# Patient Record
Sex: Male | Born: 1985 | Race: Black or African American | Hispanic: No | Marital: Single | Smoking: Never smoker
Health system: Southern US, Community
[De-identification: ages and names within clinical notes are randomized; demographics above are authoritative.]

---

## 2016-01-02 ENCOUNTER — Encounter (HOSPITAL_COMMUNITY): Payer: Self-pay

## 2016-01-02 ENCOUNTER — Emergency Department (HOSPITAL_COMMUNITY)
Admission: EM | Admit: 2016-01-02 | Discharge: 2016-01-02 | Disposition: A | Discharge status: Home / Self Care | Payer: Worker's Compensation | Attending: Physician Assistant | Admitting: Physician Assistant

## 2016-01-02 ENCOUNTER — Emergency Department (HOSPITAL_COMMUNITY): Payer: Worker's Compensation

## 2016-01-02 DIAGNOSIS — S61412A Laceration without foreign body of left hand, initial encounter: Secondary | ICD-10-CM | POA: Insufficient documentation

## 2016-01-02 DIAGNOSIS — W3189XA Contact with other specified machinery, initial encounter: Secondary | ICD-10-CM | POA: Insufficient documentation

## 2016-01-02 DIAGNOSIS — Y990 Civilian activity done for income or pay: Secondary | ICD-10-CM | POA: Insufficient documentation

## 2016-01-02 DIAGNOSIS — S60032A Contusion of left middle finger without damage to nail, initial encounter: Secondary | ICD-10-CM | POA: Diagnosis not present

## 2016-01-02 DIAGNOSIS — S60022A Contusion of left index finger without damage to nail, initial encounter: Secondary | ICD-10-CM | POA: Diagnosis not present

## 2016-01-02 DIAGNOSIS — Y9389 Activity, other specified: Secondary | ICD-10-CM | POA: Insufficient documentation

## 2016-01-02 DIAGNOSIS — Y9289 Other specified places as the place of occurrence of the external cause: Secondary | ICD-10-CM | POA: Diagnosis not present

## 2016-01-02 DIAGNOSIS — S6000XA Contusion of unspecified finger without damage to nail, initial encounter: Secondary | ICD-10-CM

## 2016-01-02 MED ORDER — LIDOCAINE HCL (PF) 1 % IJ SOLN
30.0000 mL | Freq: Once | INTRAMUSCULAR | Status: AC
  Administered 2016-01-02: 30 mL via INTRADERMAL
  Filled 2016-01-02: qty 30

## 2016-01-02 MED ORDER — TETANUS-DIPHTH-ACELL PERTUSSIS 5-2.5-18.5 LF-MCG/0.5 IM SUSP
0.5000 mL | Freq: Once | INTRAMUSCULAR | Status: AC
  Administered 2016-01-02: 0.5 mL via INTRAMUSCULAR
  Filled 2016-01-02: qty 0.5

## 2016-01-02 MED ORDER — IBUPROFEN 400 MG PO TABS
600.0000 mg | ORAL_TABLET | Freq: Once | ORAL | Status: AC
  Administered 2016-01-02: 600 mg via ORAL
  Filled 2016-01-02: qty 1

## 2016-01-02 NOTE — ED Notes (Signed)
NP at bedside for suture placement.

## 2016-01-02 NOTE — ED Triage Notes (Signed)
Per Pt, Pt hit the outside of his left on a machine at work. Pt has a one inch laceration noted to the middle knuckle of the left hand that is bleeding.

## 2016-01-02 NOTE — ED Notes (Signed)
Pt and family verbalized d/c instructions, ambulatory to [facility]. Work not provided.

## 2016-01-02 NOTE — ED Provider Notes (Signed)
[facility]-EMERGENCY DEPT Provider Note   CSN: [id number] Arrival date & time: 01/02/16  1805   By signing my name below, I, Stanley Jones, attest that this documentation has been prepared under the direction and in the presence of [name], [place]. Electronically Signed: Clarisse GougeXavier Jones, Scribe. 01/03/16. 1:44 AM.   History   Chief Complaint Chief Complaint  Patient presents with  . Extremity Laceration   The history is provided by the patient. No language interpreter was used.    HPI Comments: Stanley Jones is a 30 y.o. male who presents to the Emergency Department complaining of a left hand laceration. He states he hit his hand on a machine at work. Wound currently bleeding. He has not taken any medications for pain. Pt denies numbness or tingling in his fingers. Last tetanus 2011.   History reviewed. No pertinent past medical history.  There are no active problems to display for this patient.   History reviewed. No pertinent surgical history.     Home Medications    Prior to Admission medications   Not on File    Family History No family history on file.  Social History Social History  Substance Use Topics  . Smoking status: Never Smoker  . Smokeless tobacco: Never Used  . Alcohol use Yes     Comment: occasionally      Allergies   Patient has no known allergies.   Review of Systems Review of Systems  Skin: Positive for wound. Negative for color change.  Neurological: Negative for weakness and numbness.  All other systems reviewed and are negative.    Physical Exam Updated Vital Signs BP 137/79 (BP Location: Right Arm)   Pulse 82   Temp 98.2 F (36.8 C) (Oral)   Resp 16   Ht 5\' 10"  (1.778 m)   Wt 83.5 kg   SpO2 100%   BMI 26.40 kg/m   Physical Exam  Constitutional: He is oriented to person, place, and time. He appears well-developed and well-nourished. No distress.  HENT:  Head: Normocephalic and atraumatic.  Mouth/Throat: Mucous  membranes are normal.  Eyes: EOM are normal.  Neck: Normal range of motion. Neck supple.  Cardiovascular: Normal rate.   Pulmonary/Chest: Effort normal.  Musculoskeletal: Normal range of motion.       Left hand: He exhibits tenderness and laceration. He exhibits normal range of motion, normal capillary refill, no deformity and no swelling. Normal sensation noted. Normal strength noted. He exhibits no thumb/finger opposition.       Hands: Neurological: He is alert and oriented to person, place, and time. He has normal strength. No sensory deficit.  Skin: Skin is warm, dry and intact. No rash noted. No cyanosis.  Psychiatric: He has a normal mood and affect. His speech is normal and behavior is normal. Thought content normal.  Nursing note and vitals reviewed.    ED Treatments / Results  DIAGNOSTIC STUDIES: Oxygen Saturation is 100% on RA, normal  by my interpretation.    COORDINATION OF CARE: 1:44 AM Discussed treatment plan with pt at bedside and pt agreed to plan.  Labs (all labs ordered are listed, but only abnormal results are displayed) Labs Reviewed - No data to display   Radiology Dg Hand Complete Left  Result Date: 01/02/2016 CLINICAL DATA:  Lacerations around the second and third MCP joints after hitting hand on an oven. EXAM: LEFT HAND - COMPLETE 3+ VIEW COMPARISON:  None. FINDINGS: There is no evidence of acute fracture or dislocation. No radiopaque foreign  body. Carpal rows are maintained. The distal radius and ulna are unremarkable. There is no evidence of arthropathy or other focal bone abnormality. Soft tissues are unremarkable. IMPRESSION: Negative. Electronically Signed   By: Tollie Ethavid  Kwon M.D.   On: 01/02/2016 19:37    Procedures .Marland Kitchen.Laceration Repair Date/Time: 01/02/2016 8:03 PM Performed by: [name], HOPE M Authorized by: [name], HOPE M   Consent:    Consent obtained:  Verbal   Consent given by:  Patient   Risks discussed:  Infection and pain Anesthesia (see  MAR for exact dosages):    Anesthesia method:  Local infiltration   Local anesthetic:  Lidocaine 1% w/o epi Laceration details:    Location:  Hand   Hand location:  L hand, dorsum (Base of ring finger)   Length (cm):  1.2 Repair type:    Repair type:  Simple Pre-procedure details:    Preparation:  Patient was prepped and draped in usual sterile fashion and imaging obtained to evaluate for foreign bodies Exploration:    Hemostasis achieved with:  Direct pressure   Wound exploration: wound explored through full range of motion     Contaminated: no   Treatment:    Area cleansed with:  Saline and Betadine   Amount of cleaning:  Standard   Irrigation solution:  Sterile saline   Irrigation method:  Syringe   Visualized foreign bodies/material removed: no   Skin repair:    Repair method:  Sutures   Suture size:  5-0   Suture material:  Prolene   Suture technique:  Simple interrupted   Number of sutures:  2 Approximation:    Approximation:  Close   Vermilion border: well-aligned   Post-procedure details:    Dressing:  Sterile dressing   Patient tolerance of procedure:  Tolerated well, no immediate complications Comments:     Tetanus updated .Marland Kitchen.Laceration Repair Date/Time: 01/02/2016 8:06 PM Performed by: [name], HOPE M Authorized by: [name], HOPE M   Consent:    Consent obtained:  Verbal   Consent given by:  Patient   Risks discussed:  Infection and pain Anesthesia (see MAR for exact dosages):    Anesthesia method:  Local infiltration   Local anesthetic:  Lidocaine 1% w/o epi Laceration details:    Location:  Hand   Hand location:  L hand, dorsum   Length (cm):  1.3 (Base of middle finger.) Repair type:    Repair type:  Simple Pre-procedure details:    Preparation:  Patient was prepped and draped in usual sterile fashion and imaging obtained to evaluate for foreign bodies Exploration:    Hemostasis achieved with:  Direct pressure   Wound exploration: wound explored  through full range of motion and entire depth of wound probed and visualized     Contaminated: no   Treatment:    Area cleansed with:  Betadine and saline   Amount of cleaning:  Standard   Irrigation solution:  Sterile saline   Irrigation method:  Syringe   Visualized foreign bodies/material removed: no   Skin repair:    Repair method:  Sutures   Suture size:  5-0   Suture material:  Prolene   Suture technique:  Simple interrupted   Number of sutures:  2 Approximation:    Approximation:  Close   Vermilion border: well-aligned   Post-procedure details:    Dressing:  Sterile dressing   Patient tolerance of procedure:  Tolerated well, no immediate complications   (including critical care time)  Medications Ordered in ED Medications  ibuprofen (ADVIL,MOTRIN) tablet 600 mg (600 mg Oral Given 01/02/16 1916)  Tdap (BOOSTRIX) injection 0.5 mL (0.5 mLs Intramuscular Given 01/02/16 1916)  lidocaine (PF) (XYLOCAINE) 1 % injection 30 mL (30 mLs Intradermal Given 01/02/16 1917)     Initial Impression / Assessment and Plan / ED Course  I have reviewed the triage vital signs and the nursing notes.  Pertinent  imaging results that were available during my care of the patient were reviewed by me and considered in my medical decision making (see chart for details).  Clinical Course     Tetanus updated in ED. Laceration occurred < 12 hours prior to repair. Discussed laceration care with pt and answered questions. Pt to f-u for suture removal in 7-10 days and wound check sooner should there be signs of dehiscence or infection. Pt is hemodynamically stable with no complaints prior to dc.     Final Clinical Impressions(s) / ED Diagnoses   Final diagnoses:  Laceration of left hand without foreign body, initial encounter  Contusion of finger of left hand, initial encounter    New Prescriptions There are no discharge medications for this patient. I personally performed the services described  in this documentation, which was scribed in my presence. The recorded information has been reviewed and is accurate.    [place], NP 01/03/16 0144    Courteney Randall An, MD 01/03/16 [phone]

## 2017-05-15 IMAGING — CR DG HAND COMPLETE 3+V*L*
3 series · 3 of 3 positions shown · non-contrast
Comparison: None.

CLINICAL DATA: Lacerations around the second and third MCP joints
after hitting hand on an oven.

EXAM:
LEFT HAND - COMPLETE 3+ VIEW

[hand pa]
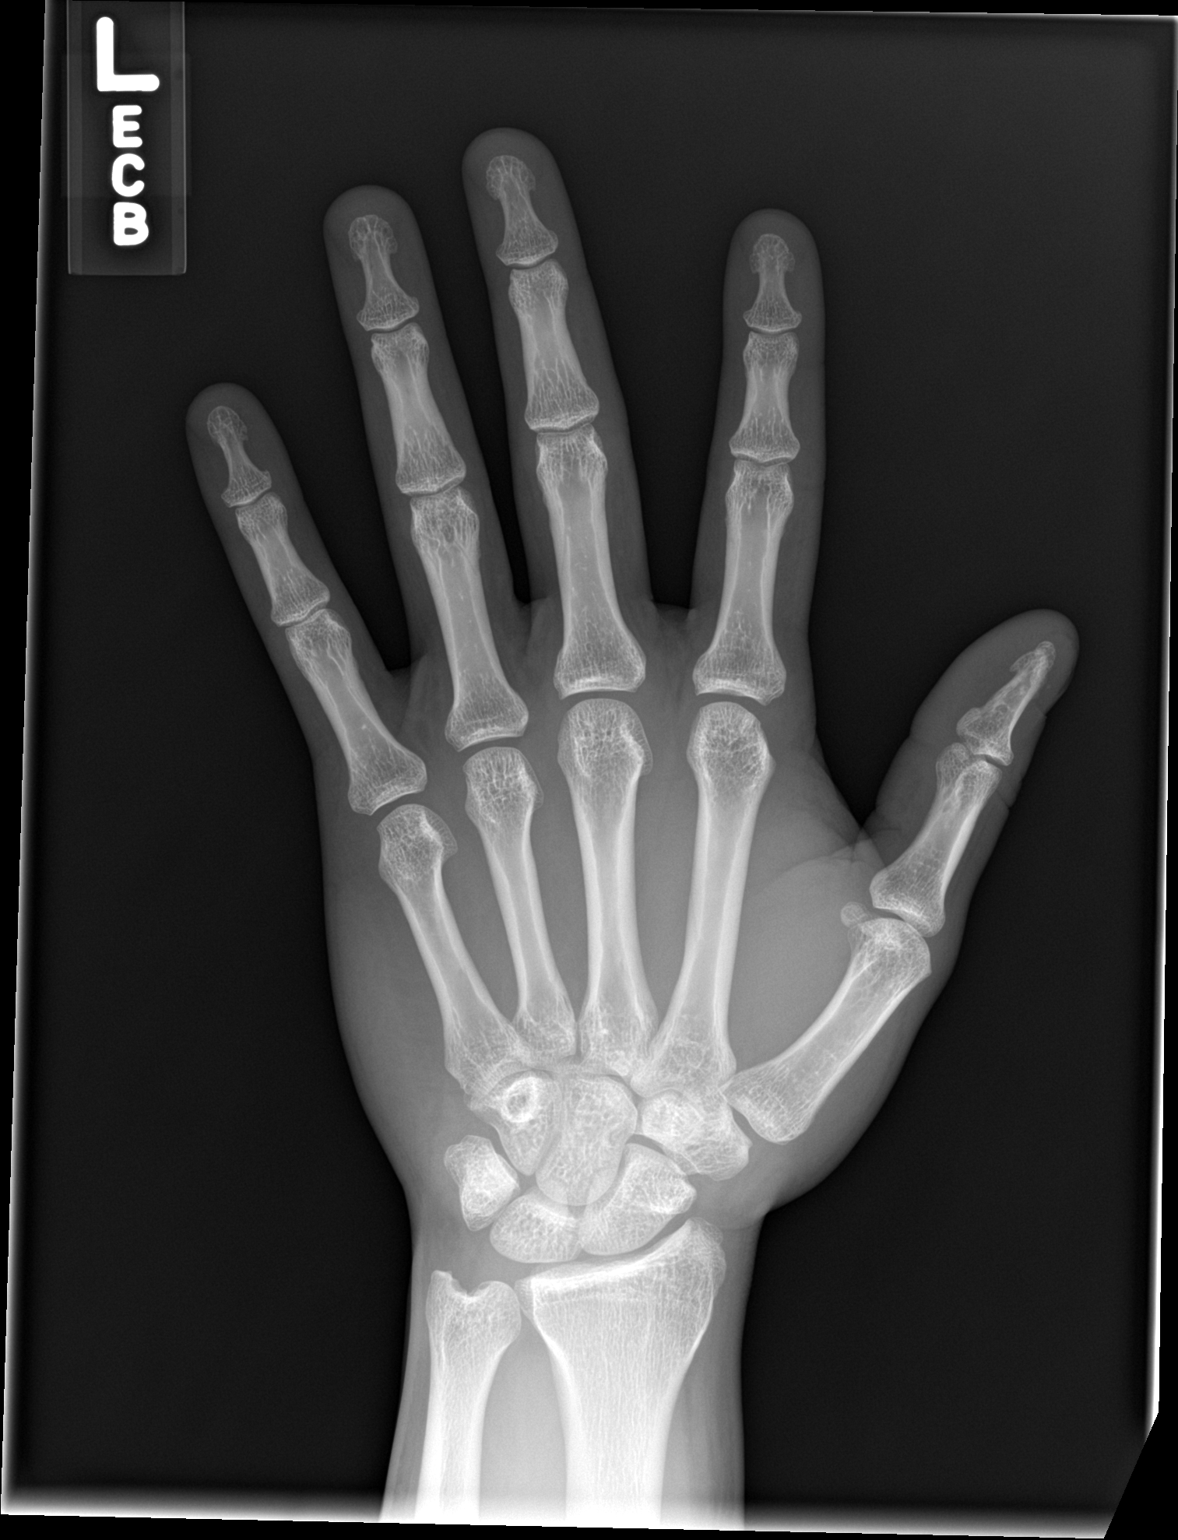

[hand obl]
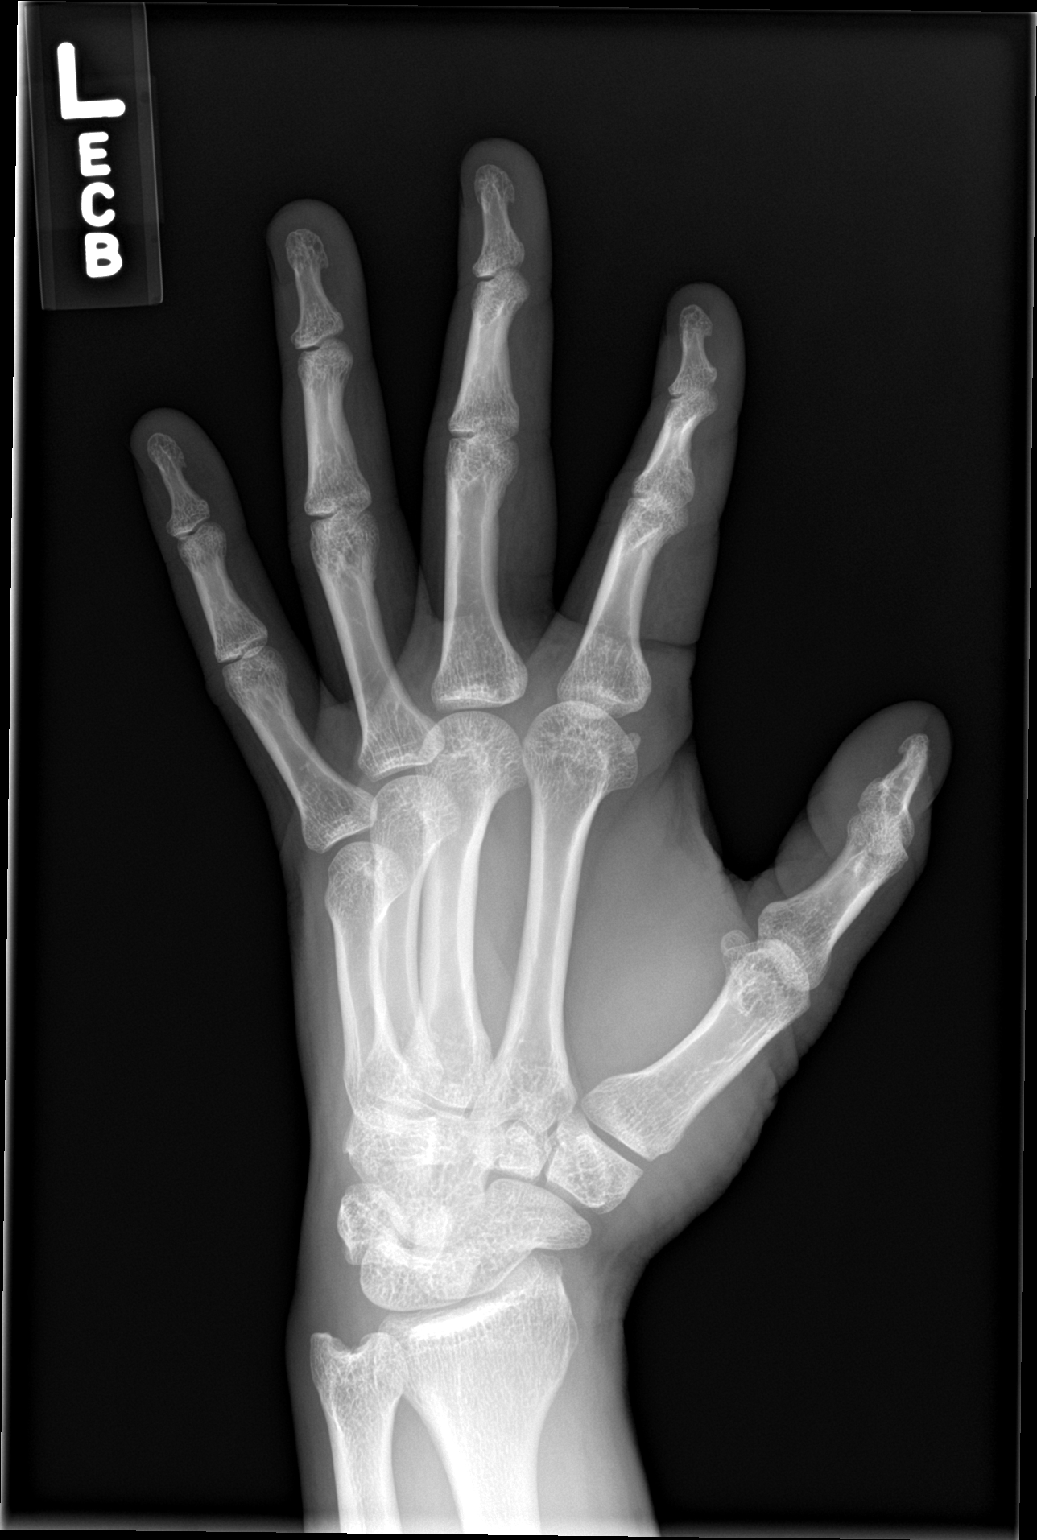

[hand lat]
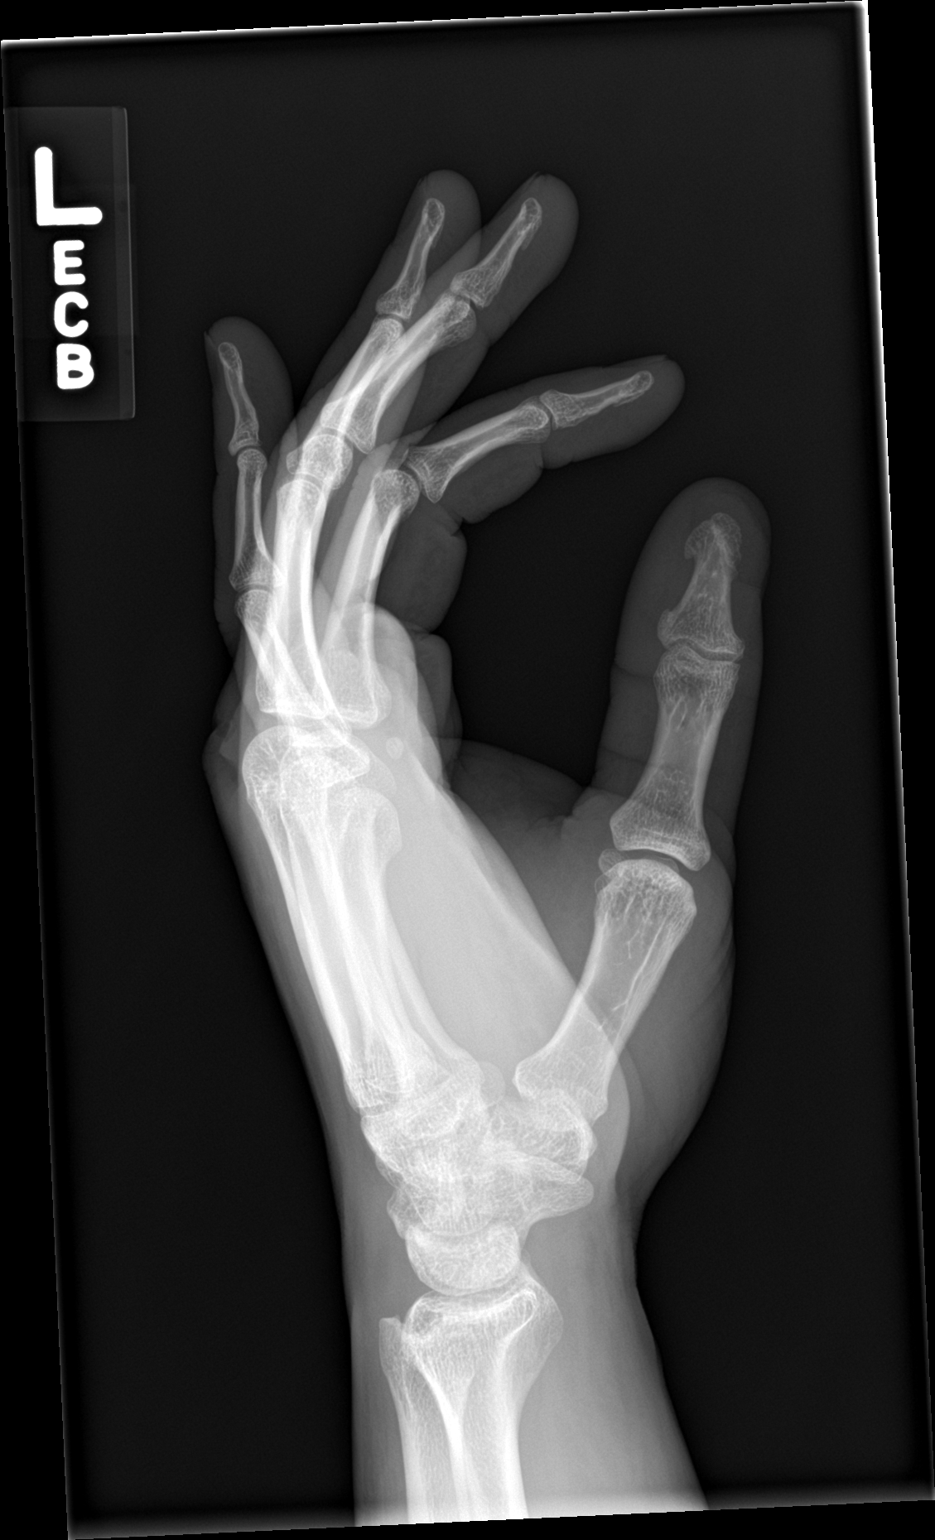

[3 of 3 positions shown; findings below may reference images not displayed]

FINDINGS: There is no evidence of acute fracture or dislocation. No radiopaque
foreign body. Carpal rows are maintained. The distal radius and ulna
are unremarkable. There is no evidence of arthropathy or other focal
bone abnormality. Soft tissues are unremarkable.
IMPRESSION: Negative.
